# Patient Record
Sex: Male | Born: 1995 | Race: Black or African American | Hispanic: No | Marital: Single | Smoking: Never smoker
Health system: Southern US, Community
[De-identification: ages and names within clinical notes are randomized; demographics above are authoritative.]

---

## 2016-12-04 ENCOUNTER — Emergency Department: Payer: Self-pay

## 2016-12-04 ENCOUNTER — Encounter: Payer: Self-pay | Admitting: Emergency Medicine

## 2016-12-04 ENCOUNTER — Emergency Department
Admission: EM | Admit: 2016-12-04 | Discharge: 2016-12-04 | Disposition: A | Discharge status: Home / Self Care | Payer: Self-pay | Attending: Emergency Medicine | Admitting: Emergency Medicine

## 2016-12-04 DIAGNOSIS — R0602 Shortness of breath: Secondary | ICD-10-CM | POA: Insufficient documentation

## 2016-12-04 DIAGNOSIS — H81399 Other peripheral vertigo, unspecified ear: Secondary | ICD-10-CM | POA: Insufficient documentation

## 2016-12-04 DIAGNOSIS — F1290 Cannabis use, unspecified, uncomplicated: Secondary | ICD-10-CM | POA: Insufficient documentation

## 2016-12-04 LAB — CBC
HEMATOCRIT: 44.3 % (ref 40.0–52.0)
HEMOGLOBIN: 15.3 g/dL (ref 13.0–18.0)
MCH: 27.6 pg (ref 26.0–34.0)
MCHC: 34.6 g/dL (ref 32.0–36.0)
MCV: 79.8 fL — ABNORMAL LOW (ref 80.0–100.0)
Platelets: 223 10*3/uL (ref 150–440)
RBC: 5.55 MIL/uL (ref 4.40–5.90)
RDW: 12.6 % (ref 11.5–14.5)
WBC: 6.7 10*3/uL (ref 3.8–10.6)

## 2016-12-04 LAB — BASIC METABOLIC PANEL
ANION GAP: 9 (ref 5–15)
BUN: 12 mg/dL (ref 6–20)
CO2: 27 mmol/L (ref 22–32)
Calcium: 9.7 mg/dL (ref 8.9–10.3)
Chloride: 103 mmol/L (ref 101–111)
Creatinine, Ser: 1 mg/dL (ref 0.61–1.24)
GFR calc Af Amer: >60 mL/min (ref >60)
GFR calc non Af Amer: >60 mL/min (ref >60)
GLUCOSE: 110 mg/dL — AB (ref 65–99)
POTASSIUM: 3.3 mmol/L — AB (ref 3.5–5.1)
Sodium: 139 mmol/L (ref 135–145)

## 2016-12-04 LAB — TROPONIN I: Troponin I: <0.03 ng/mL (ref <0.03)

## 2016-12-04 NOTE — ED Triage Notes (Addendum)
Pt presents to ED with dizziness and sob since yesterday. Pt states his symptoms worsen while laying down and he feels like the "room is spinning". Pt is alert and calm with no increased work of breathing noted at this time. Pt denies hx of vertigo but states his mother has had it before. Denies chest pain.

## 2016-12-04 NOTE — Discharge Instructions (Signed)
Please return to the emergency department for any new or worsening symptoms such as fevers, chills, if your vertigo returns and does not go away, or for any other concerns. Otherwise please establish care with a primary care physician for recheck.  It was a pleasure to take care of you today, and thank you for coming to our emergency department.  If you have any questions or concerns before leaving please ask the nurse to grab me and I'm more than happy to go through your aftercare instructions again.  If you were prescribed any opioid pain medication today such as Norco, Vicodin, Percocet, morphine, hydrocodone, or oxycodone please make sure you do not drive when you are taking this medication as it can alter your ability to drive safely.  If you have any concerns once you are home that you are not improving or are in fact getting worse before you can make it to your follow-up appointment, please do not hesitate to call 911 and come back for further evaluation.  Merrily BrittleNeil Jeanann Balinski MD  Results for orders placed or performed during the hospital encounter of 12/04/16  Basic metabolic panel  Result Value Ref Range   Sodium 139 135 - 145 mmol/L   Potassium 3.3 (L) 3.5 - 5.1 mmol/L   Chloride 103 101 - 111 mmol/L   CO2 27 22 - 32 mmol/L   Glucose, Bld 110 (H) 65 - 99 mg/dL   BUN 12 6 - 20 mg/dL   Creatinine, Ser 4.741.00 0.61 - 1.24 mg/dL   Calcium 9.7 8.9 - 25.910.3 mg/dL   GFR calc non Af Amer >60 >60 mL/min   GFR calc Af Amer >60 >60 mL/min   Anion gap 9 5 - 15  CBC  Result Value Ref Range   WBC 6.7 3.8 - 10.6 K/uL   RBC 5.55 4.40 - 5.90 MIL/uL   Hemoglobin 15.3 13.0 - 18.0 g/dL   HCT 56.344.3 87.540.0 - 64.352.0 %   MCV 79.8 (L) 80.0 - 100.0 fL   MCH 27.6 26.0 - 34.0 pg   MCHC 34.6 32.0 - 36.0 g/dL   RDW 32.912.6 51.811.5 - 84.114.5 %   Platelets 223 150 - 440 K/uL  Troponin I  Result Value Ref Range   Troponin I <0.03 <0.03 ng/mL   Dg Chest 2 View  Result Date: 12/04/2016 CLINICAL DATA:  Acute onset of  dizziness and shortness of breath. Vertigo. Initial encounter. EXAM: CHEST  2 VIEW COMPARISON:  None. FINDINGS: The lungs are well-aerated and clear. There is no evidence of focal opacification, pleural effusion or pneumothorax. The heart is normal in size; the mediastinal contour is within normal limits. No acute osseous abnormalities are seen. IMPRESSION: No acute cardiopulmonary process seen. Electronically Signed   By: Roanna RaiderJeffery  Chang M.D.   On: 12/04/2016 01:05

## 2016-12-04 NOTE — ED Provider Notes (Signed)
[facility] Emergency Department Provider Note  ____________________________________________   First MD Initiated Contact with Patient 12/04/16 0202     (approximate)  I have reviewed the triage vital signs and the nursing notes.   HISTORY  Chief Complaint Dizziness and Shortness of Breath    HPI Danny Sullivan is a 21 y.o. male who comes to the emergency department with a brief episode of room spinning vertigo that happened several hours prior to arrival. He laid down in bed and closed his eyes and felt the entire world spinning. He opened his eyes and it resolved. He was associated with some nausea. He did not vomit. No numbness or weakness. No headache. No ear pain. No tinnitus. No recent illness. Symptoms have not recurred. They were not positional.   History reviewed. No pertinent past medical history.  There are no active problems to display for this patient.   History reviewed. No pertinent surgical history.  Prior to Admission medications   Not on File    Allergies Patient has no known allergies.  No family history on file.  Social History Social History  Substance Use Topics  . Smoking status: Never Smoker  . Smokeless tobacco: Never Used  . Alcohol use Yes    Review of Systems Constitutional: No fever/chills Eyes: No visual changes. ENT: No sore throat. Cardiovascular: Denies chest pain. Respiratory: Positive shortness of breath. Gastrointestinal: No abdominal pain.  Positive nausea, no vomiting.  No diarrhea.  No constipation. Genitourinary: Negative for dysuria. Musculoskeletal: Negative for back pain. Skin: Negative for rash. Neurological: Negative for headaches, focal weakness or numbness.   ____________________________________________   PHYSICAL EXAM:  VITAL SIGNS: ED Triage Vitals [12/04/16 0042]  Enc Vitals Group     BP 136/89     Pulse Rate 73     Resp 18     Temp 98.3 F (36.8 C)     Temp  Source Oral     SpO2 100 %     Weight 177 lb (80.3 kg)     Height 6\' 3"  (1.905 m)     Head Circumference      Peak Flow      Pain Score      Pain Loc      Pain Edu?      Excl. in GC?     Constitutional: Alert and oriented x 4 well appearing nontoxic no diaphoresis speaks in full, clear sentences Eyes: PERRL EOMI.No nystagmus Head: Atraumatic. Nose: No congestion/rhinnorhea. Mouth/Throat: No trismus normal tympanic membrane is bilaterally Neck: No stridor.   Cardiovascular: Normal rate, regular rhythm. Grossly normal heart sounds.  Good peripheral circulation. Respiratory: Normal respiratory effort.  No retractions. Lungs CTAB and moving good air Gastrointestinal: Soft nontender Musculoskeletal: No lower extremity edema   Neurologic:  Normal speech and language. No gross focal neurologic deficits are appreciated. Normal finger-nose-finger Skin:  Skin is warm, dry and intact. No rash noted. Psychiatric: Mood and affect are normal. Speech and behavior are normal.    ____________________________________________   DIFFERENTIAL  Peripheral vertigo, central vertigo, pneumonia, bronchitis ____________________________________________   LABS (all labs ordered are listed, but only abnormal results are displayed)  Labs Reviewed  BASIC METABOLIC PANEL - Abnormal; Notable for the following:       Result Value   Potassium 3.3 (*)    Glucose, Bld 110 (*)    All other components within normal limits  CBC - Abnormal; Notable for the following:    MCV 79.8 (*)  All other components within normal limits  TROPONIN I    Labs unremarkable __________________________________________  EKG  ED ECG REPORT I, Merrily BrittleNeil Marvon Shillingburg, the attending physician, personally viewed and interpreted this ECG.  Date: 12/04/2016 Rate: 77 Rhythm: normal sinus rhythm QRS Axis: normal Intervals: normal ST/T Wave abnormalities: normal Conduction Disturbances: none Narrative Interpretation:  unremarkable  ____________________________________________  RADIOLOGY  Chest x-ray with no acute disease ____________________________________________   PROCEDURES  Procedure(s) performed: no  Procedures  Critical Care performed: no  Observation: no ____________________________________________   INITIAL IMPRESSION / ASSESSMENT AND PLAN / ED COURSE  Pertinent labs & imaging results that were available during my care of the patient were reviewed by me and considered in my medical decision making (see chart for details).  The patient gives a history of clear peripheral vertigo although not definitely BPPV, labyrinthitis, or Mnire's disease. Regardless he is currently asymptomatic and his last only a few moments. Given reassurance and primary care follow-up.      ____________________________________________   FINAL CLINICAL IMPRESSION(S) / ED DIAGNOSES  Final diagnoses:  Peripheral vertigo, unspecified laterality      NEW MEDICATIONS STARTED DURING THIS VISIT:  There are no discharge medications for this patient.    Note:  This document was prepared using Dragon voice recognition software and may include unintentional dictation errors.     Merrily Brittleifenbark, Danny Siller, MD 12/04/16 [phone]

## 2018-05-15 IMAGING — CR DG CHEST 2V
2 series · 2 of 2 positions shown · non-contrast
Comparison: None.

CLINICAL DATA: Acute onset of dizziness and shortness of breath.
Vertigo. Initial encounter.

EXAM:
CHEST  2 VIEW

[chest pa]
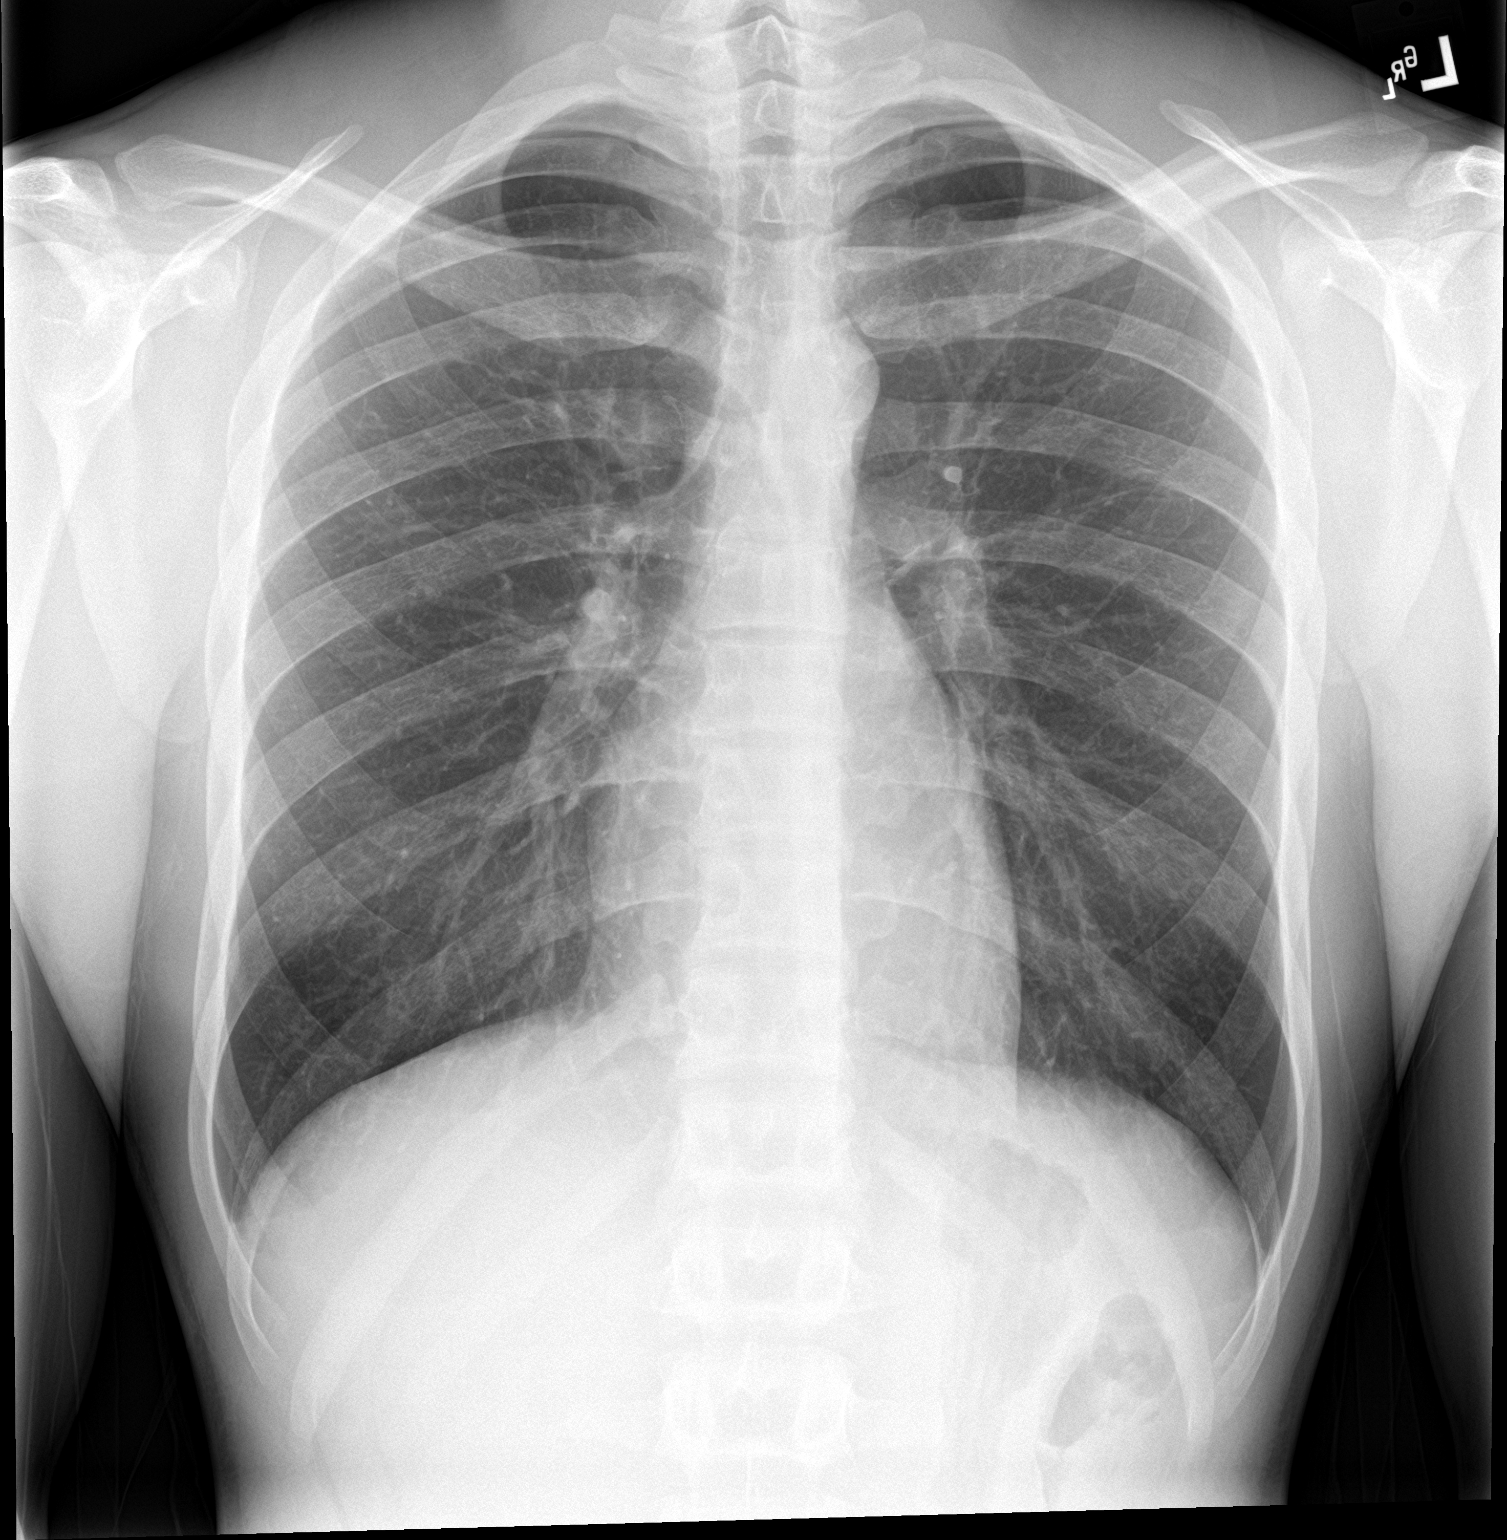

[chest lat]
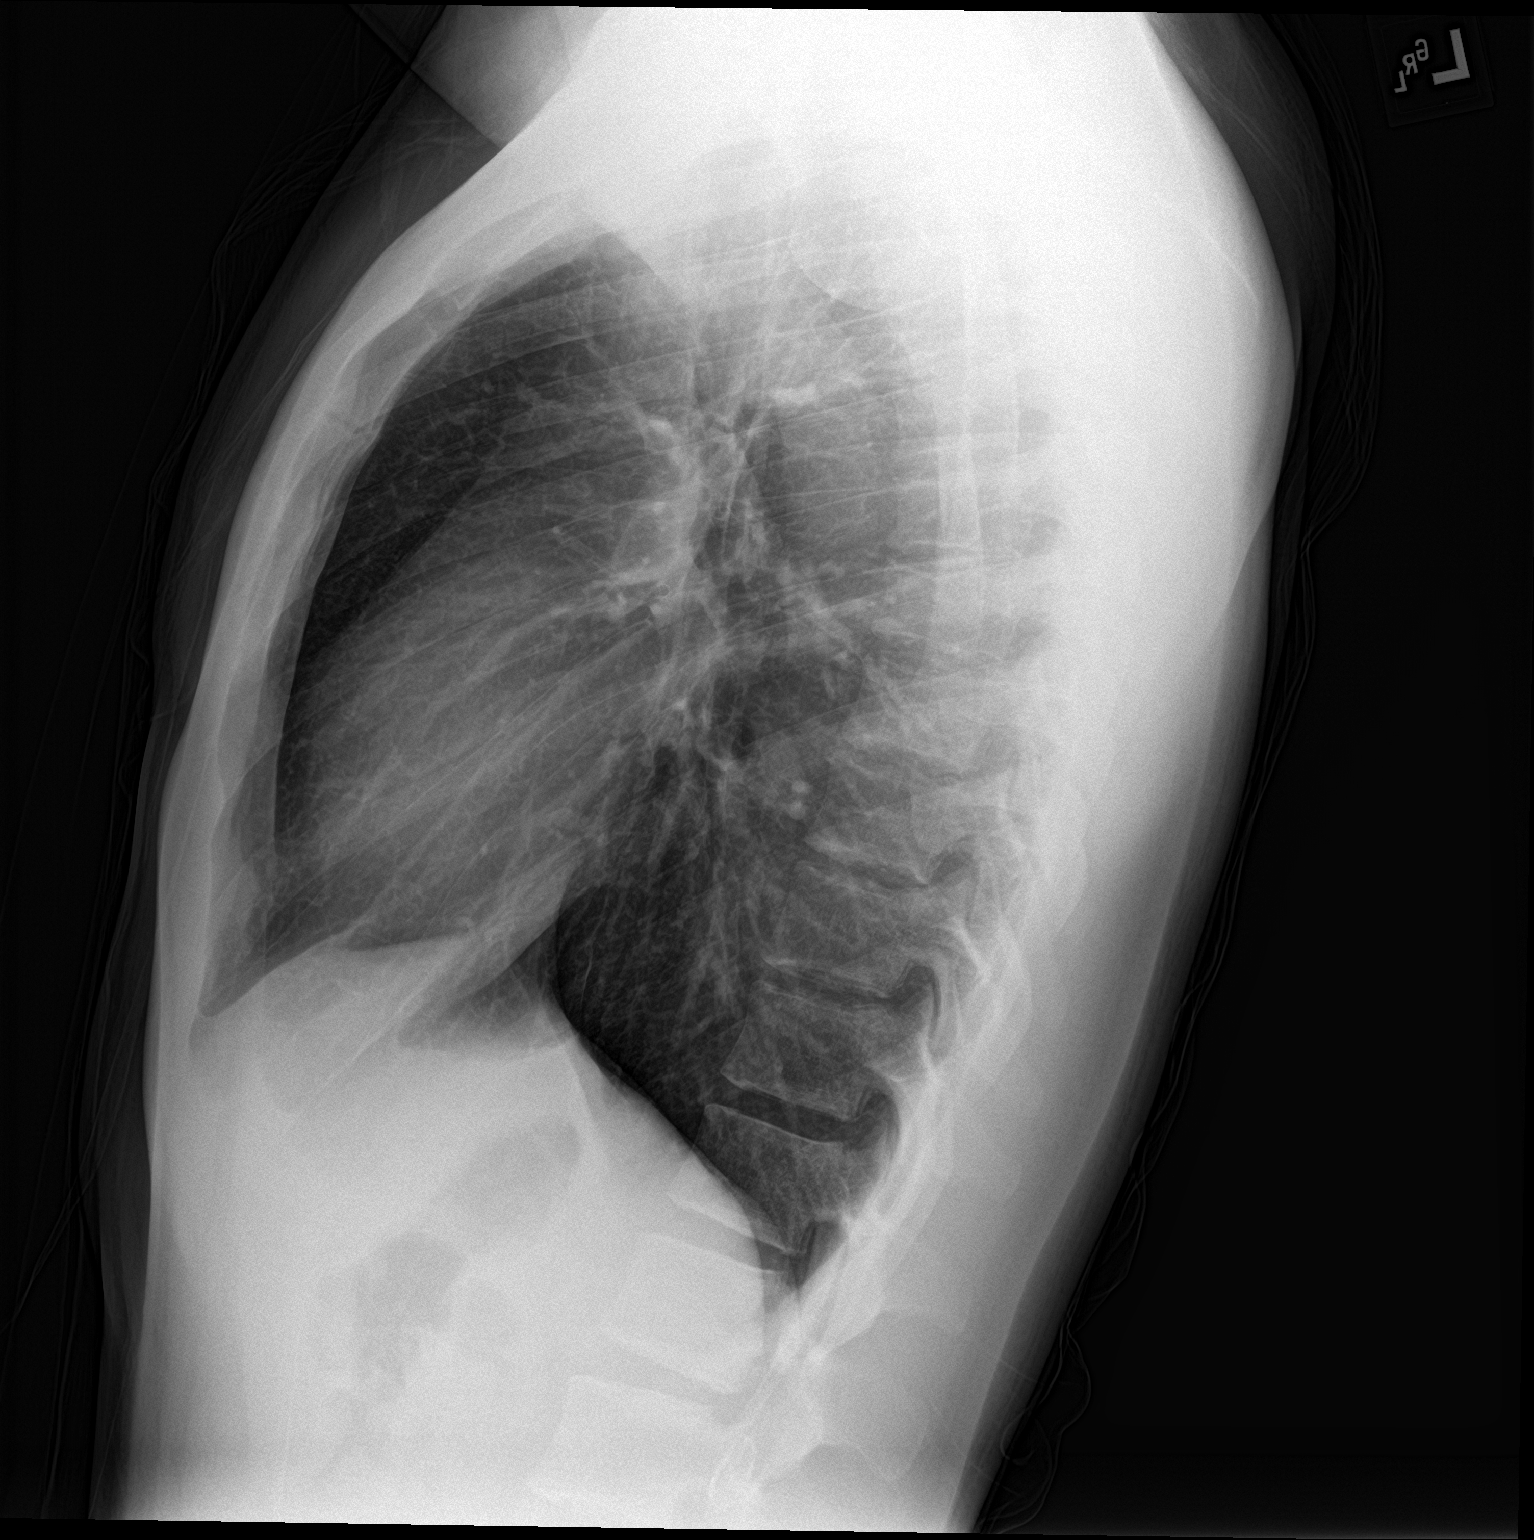

[2 of 2 positions shown; findings below may reference images not displayed]

FINDINGS: The lungs are well-aerated and clear. There is no evidence of focal
opacification, pleural effusion or pneumothorax.

The heart is normal in size; the mediastinal contour is within
normal limits. No acute osseous abnormalities are seen.
IMPRESSION: No acute cardiopulmonary process seen.
